# Patient Record
Sex: Male | Born: 2000
Health system: Southern US, Community
[De-identification: ages and names within clinical notes are randomized; demographics above are authoritative.]

## PROBLEM LIST (undated history)

## (undated) DIAGNOSIS — L309 Dermatitis, unspecified: Secondary | ICD-10-CM

## (undated) HISTORY — DX: Dermatitis, unspecified: L30.9

---
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
## 2021-03-17 DIAGNOSIS — Z23 Encounter for immunization: Secondary | ICD-10-CM | POA: Diagnosis not present
